# Patient Record
Sex: Male | Born: 2000 | Hispanic: Yes | Marital: Single | Smoking: Never smoker
Health system: Southern US, Community
[De-identification: ages and names within clinical notes are randomized; demographics above are authoritative.]

---
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
## 2020-10-23 ENCOUNTER — Emergency Department (HOSPITAL_COMMUNITY)
Admission: EM | Admit: 2020-10-23 | Discharge: 2020-10-23 | Disposition: A | Discharge status: Home / Self Care | Payer: BC Managed Care – PPO | Attending: Emergency Medicine | Admitting: Emergency Medicine

## 2020-10-23 ENCOUNTER — Other Ambulatory Visit: Payer: Self-pay

## 2020-10-23 ENCOUNTER — Emergency Department (HOSPITAL_COMMUNITY): Payer: BC Managed Care – PPO

## 2020-10-23 ENCOUNTER — Encounter (HOSPITAL_COMMUNITY): Payer: Self-pay | Admitting: Emergency Medicine

## 2020-10-23 DIAGNOSIS — S0081XA Abrasion of other part of head, initial encounter: Secondary | ICD-10-CM | POA: Insufficient documentation

## 2020-10-23 DIAGNOSIS — T1502XA Foreign body in cornea, left eye, initial encounter: Secondary | ICD-10-CM | POA: Diagnosis not present

## 2020-10-23 DIAGNOSIS — T1592XA Foreign body on external eye, part unspecified, left eye, initial encounter: Secondary | ICD-10-CM

## 2020-10-23 DIAGNOSIS — Y92410 Unspecified street and highway as the place of occurrence of the external cause: Secondary | ICD-10-CM | POA: Diagnosis not present

## 2020-10-23 DIAGNOSIS — S161XXA Strain of muscle, fascia and tendon at neck level, initial encounter: Secondary | ICD-10-CM | POA: Insufficient documentation

## 2020-10-23 DIAGNOSIS — W25XXXA Contact with sharp glass, initial encounter: Secondary | ICD-10-CM | POA: Insufficient documentation

## 2020-10-23 DIAGNOSIS — S199XXA Unspecified injury of neck, initial encounter: Secondary | ICD-10-CM | POA: Diagnosis present

## 2020-10-23 DIAGNOSIS — S60511A Abrasion of right hand, initial encounter: Secondary | ICD-10-CM | POA: Diagnosis not present

## 2020-10-23 MED ORDER — FLUORESCEIN SODIUM 1 MG OP STRP
1.0000 | ORAL_STRIP | Freq: Once | OPHTHALMIC | Status: AC
  Administered 2020-10-23: 1 via OPHTHALMIC
  Filled 2020-10-23: qty 1

## 2020-10-23 MED ORDER — IBUPROFEN 400 MG PO TABS
600.0000 mg | ORAL_TABLET | Freq: Once | ORAL | Status: AC
  Administered 2020-10-23: 600 mg via ORAL
  Filled 2020-10-23: qty 1

## 2020-10-23 MED ORDER — TETRACAINE HCL 0.5 % OP SOLN
2.0000 [drp] | Freq: Once | OPHTHALMIC | Status: AC
  Administered 2020-10-23: 2 [drp] via OPHTHALMIC
  Filled 2020-10-23: qty 4
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
## 2020-10-23 NOTE — Discharge Instructions (Signed)
If you develop severe headache, new or worsening pain, vomiting, weakness or numbness in her extremities, severe chest pain, shortness of breath, or abdominal pain, or any other new/concerning symptoms then return to the ER for evaluation.
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
## 2023-07-22 ENCOUNTER — Encounter: Payer: Self-pay | Admitting: Emergency Medicine

## 2023-07-22 ENCOUNTER — Encounter (HOSPITAL_COMMUNITY): Payer: Self-pay

## 2023-07-22 ENCOUNTER — Other Ambulatory Visit: Payer: Self-pay

## 2023-07-22 ENCOUNTER — Emergency Department (HOSPITAL_COMMUNITY)
Admission: EM | Admit: 2023-07-22 | Discharge: 2023-07-22 | Disposition: A | Discharge status: Home / Self Care | Payer: Medicaid Other | Attending: Emergency Medicine | Admitting: Emergency Medicine

## 2023-07-22 ENCOUNTER — Ambulatory Visit
Admission: EM | Admit: 2023-07-22 | Discharge: 2023-07-22 | Disposition: A | Discharge status: Home / Self Care | Payer: Medicaid Other

## 2023-07-22 DIAGNOSIS — R001 Bradycardia, unspecified: Secondary | ICD-10-CM | POA: Insufficient documentation

## 2023-07-22 DIAGNOSIS — M795 Residual foreign body in soft tissue: Secondary | ICD-10-CM | POA: Insufficient documentation

## 2023-07-22 DIAGNOSIS — S90454A Superficial foreign body, right lesser toe(s), initial encounter: Secondary | ICD-10-CM

## 2023-07-22 MED ORDER — LIDOCAINE HCL (PF) 1 % IJ SOLN
5.0000 mL | Freq: Once | INTRAMUSCULAR | Status: AC
  Administered 2023-07-22: 5 mL
  Filled 2023-07-22: qty 5

## 2023-07-22 NOTE — ED Provider Notes (Signed)
[facility]    CSN: [id number] Arrival date & time: 07/22/23  1401      History   Chief Complaint Chief Complaint  Patient presents with   Foreign Body in Skin    HPI Larry Porter is a 22 y.o. male.   Patient presents to urgent care today for evaluation of wooden splinter and second right toe.  He states about an hour ago he was on his wooden deck and accidentally slid his toe against the wood.  He is up-to-date with his tetanus vaccinations.  He states he did clean the wound with alcohol bacitracin and tried removal with a needle without success.  Was not wearing shoes at the time of injury.  The history is provided by the patient.    History reviewed. No pertinent past medical history.  There are no problems to display for this patient.   History reviewed. No pertinent surgical history.     Home Medications    Prior to Admission medications   Medication Sig Start Date End Date Taking? Authorizing Provider  Multiple Vitamin (MULTIVITAMIN WITH MINERALS) TABS tablet Take 1 tablet by mouth daily.    [provider]    Family History History reviewed. No pertinent family history.  Social History Social History   Tobacco Use   Smoking status: Never  Substance Use Topics   Alcohol use: No     Allergies   Patient has no known allergies.   Review of Systems Review of Systems  Constitutional:  Negative for chills and fever.  Eyes:  Negative for discharge and redness.  Respiratory:  Negative for shortness of breath.   Skin:  Positive for wound.  Neurological:  Negative for numbness.     Physical Exam Triage Vital Signs ED Triage Vitals  Encounter Vitals Group     BP      Systolic BP Percentile      Diastolic BP Percentile      Pulse      Resp      Temp      Temp src      SpO2      Weight      Height      Head Circumference      Peak Flow      Pain Score      Pain Loc      Pain Education      Exclude from Growth  Chart    No data found.  Updated Vital Signs BP 135/83 (BP Location: Left Arm)   Pulse 65   Temp 98 F (36.7 C) (Oral)   Resp 16   SpO2 98%     Physical Exam Vitals and nursing note reviewed.  Constitutional:      General: He is not in acute distress.    Appearance: Normal appearance. He is not ill-appearing.  HENT:     Head: Normocephalic and atraumatic.  Eyes:     Conjunctiva/sclera: Conjunctivae normal.  Cardiovascular:     Rate and Rhythm: Normal rate.  Pulmonary:     Effort: Pulmonary effort is normal. No respiratory distress.  Musculoskeletal:     Comments: Normal ROM of right toes  Skin:    Capillary Refill: Normal cap refill to right toes    Comments: Nonbleeding wound noted to right distal 2nd toe- no FB observed on exam- patient reports he can "feel" the splinter on palpation of distal toe  Neurological:     Mental Status: He is alert.  Comments: Gross sensation intact to distal right toes  Psychiatric:        Mood and Affect: Mood normal.        Behavior: Behavior normal.        Thought Content: Thought content normal.      UC Treatments / Results  Labs (all labs ordered are listed, but only abnormal results are displayed) Labs Reviewed - No data to display  EKG   Radiology No results found.  Procedures Procedures (including critical care time)  Medications Ordered in UC Medications - No data to display  Initial Impression / Assessment and Plan / UC Course  I have reviewed the triage vital signs and the nursing notes.  Pertinent labs & imaging results that were available during my care of the patient were reviewed by me and considered in my medical decision making (see chart for details).    Given inability to visualize foreign body on exam recommended further evaluation in the emergency room for more complicated removal.  Patient is agreeable to same.  Final Clinical Impressions(s) / UC Diagnoses   Final diagnoses:  Foreign body in  skin of right lesser toe     Discharge Instructions       Please report to ED     ED Prescriptions   None    PDMP not reviewed this encounter.   Tomi Bamberger, PA-C 07/22/23 [phone]

## 2023-07-22 NOTE — ED Provider Notes (Signed)
  [place] EMERGENCY DEPARTMENT AT [facility] Provider Note   CSN: [id number] Arrival date & time: 07/22/23  1457     History  Chief Complaint  Patient presents with   Foreign Body in Skin    Larry Porter is a 22 y.o. male.  Patient is a healthy 22 year old male presenting today with a splinter in his right foot on the third toe on the plantar surface.  He was walking across a deck with no shoes on and got the splinter in there.  He reports he was unable to get it out and went to urgent care and they could not get it out and sent him here.  His tetanus shot is up-to-date he has no other complaints.  The history is provided by the patient.       Home Medications Prior to Admission medications   Medication Sig Start Date End Date Taking? Authorizing Provider  Multiple Vitamin (MULTIVITAMIN WITH MINERALS) TABS tablet Take 1 tablet by mouth daily.    [provider]      Allergies    Patient has no known allergies.    Review of Systems   Review of Systems  Physical Exam Updated Vital Signs BP 127/77   Pulse (!) 53   Temp 98.2 F (36.8 C)   Resp 13   Ht 5\' 6"  (1.676 m)   Wt 65.8 kg   SpO2 100%   BMI 23.40 kg/m  Physical Exam Vitals reviewed.  HENT:     Head: Normocephalic.  Cardiovascular:     Rate and Rhythm: Bradycardia present.  Pulmonary:     Effort: Pulmonary effort is normal.  Musculoskeletal:       Feet:  Neurological:     Mental Status: He is alert.     ED Results / Procedures / Treatments   Labs (all labs ordered are listed, but only abnormal results are displayed) Labs Reviewed - No data to display  EKG None  Radiology No results found.  Procedures .Foreign Body Removal  Date/Time: 07/22/2023 4:49 PM  Performed by: Gwyneth Sprout, MD Authorized by: Gwyneth Sprout, MD  Consent: Verbal consent obtained. Body area: skin General location: lower extremity Location details: right second toe Anesthesia:  digital block  Anesthesia: Local Anesthetic: lidocaine 1% without epinephrine Anesthetic total: 3 mL  Sedation: Patient sedated: no  Patient restrained: no Patient cooperative: yes Localization method: visualized Removal mechanism: scalpel and forceps Dressing: dressing applied Tendon involvement: none Depth: subcutaneous Complexity: simple 1 objects recovered. Objects recovered: splinter Post-procedure assessment: foreign body removed Patient tolerance: patient tolerated the procedure well with no immediate complications      Medications Ordered in ED Medications  lidocaine (PF) (XYLOCAINE) 1 % injection 5 mL (5 mLs Infiltration Given by Other 07/22/23 1556)    ED Course/ Medical Decision Making/ A&P                                 Medical Decision Making Risk Prescription drug management.   Patient with a splinter in his toe.  Digital block performed and splinter removed as above.  Tetanus shot is up-to-date.        Final Clinical Impression(s) / ED Diagnoses Final diagnoses:  Foreign body (FB) in soft tissue    Rx / DC Orders ED Discharge Orders     None         Gwyneth Sprout, MD 07/22/23 1652

## 2023-07-22 NOTE — Discharge Instructions (Signed)
Please report to ED

## 2023-07-22 NOTE — ED Notes (Signed)
Patient is being discharged from the Urgent Care and sent to the Emergency Department via Private Vehicle . Per Provider, patient is in need of higher level of care due to Complexity. Patient is aware and verbalizes understanding of plan of care.  Vitals:   07/22/23 1409  BP: 135/83  Pulse: 65  Resp: 16  Temp: 98 F (36.7 C)  SpO2: 98%

## 2023-07-22 NOTE — ED Notes (Signed)
Pt states he was walking on a deck and got a splinter in toe. Splinter visible in right 2nd toe. Redness and swelling noted. Last Tdap 04/2023.

## 2023-07-22 NOTE — Discharge Instructions (Signed)
You can wash the area with soap and water.  Change the Band-Aid later today.  Do not put your Barefoot in a shoe without a sock.  It should heal quickly.  You can place a little Vaseline or Neosporin on the area as needed.

## 2023-07-22 NOTE — ED Triage Notes (Signed)
Splinter in second toe of right foot. Happened 1 hour ago. Last tetanus in may of this year. Patient cleaned with alcohol, bacitracin, and tried removal with needle at home prior to arrival today unsuccessfully

## 2023-07-22 NOTE — ED Triage Notes (Signed)
Pt arrives with c/o a splinter in his right foot. Per pt, splinter has been in his for for about 2 hours.

## 2024-08-24 ENCOUNTER — Emergency Department (HOSPITAL_COMMUNITY)

## 2024-08-24 ENCOUNTER — Emergency Department (HOSPITAL_COMMUNITY)
Admission: EM | Admit: 2024-08-24 | Discharge: 2024-08-24 | Disposition: A | Discharge status: Home / Self Care | Attending: Emergency Medicine | Admitting: Emergency Medicine

## 2024-08-24 ENCOUNTER — Other Ambulatory Visit: Payer: Self-pay

## 2024-08-24 ENCOUNTER — Encounter (HOSPITAL_COMMUNITY): Payer: Self-pay | Admitting: *Deleted

## 2024-08-24 DIAGNOSIS — I880 Nonspecific mesenteric lymphadenitis: Secondary | ICD-10-CM | POA: Insufficient documentation

## 2024-08-24 DIAGNOSIS — R1030 Lower abdominal pain, unspecified: Secondary | ICD-10-CM | POA: Diagnosis present

## 2024-08-24 LAB — I-STAT CHEM 8, ED
BUN: 7 mg/dL (ref 6–20)
Calcium, Ion: 1.16 mmol/L (ref 1.15–1.40)
Chloride: 101 mmol/L (ref 98–111)
Creatinine, Ser: 1.2 mg/dL (ref 0.61–1.24)
Glucose, Bld: 110 mg/dL — ABNORMAL HIGH (ref 70–99)
HCT: 45 % (ref 39.0–52.0)
Hemoglobin: 15.3 g/dL (ref 13.0–17.0)
Potassium: 4 mmol/L (ref 3.5–5.1)
Sodium: 136 mmol/L (ref 135–145)
TCO2: 23 mmol/L (ref 22–32)

## 2024-08-24 LAB — CBC
HCT: 44.3 % (ref 39.0–52.0)
Hemoglobin: 14.9 g/dL (ref 13.0–17.0)
MCH: 30.5 pg (ref 26.0–34.0)
MCHC: 33.6 g/dL (ref 30.0–36.0)
MCV: 90.6 fL (ref 80.0–100.0)
Platelets: 234 K/uL (ref 150–400)
RBC: 4.89 MIL/uL (ref 4.22–5.81)
RDW: 11.5 % (ref 11.5–15.5)
WBC: 8.4 K/uL (ref 4.0–10.5)
nRBC: 0 % (ref 0.0–0.2)

## 2024-08-24 LAB — URINALYSIS, ROUTINE W REFLEX MICROSCOPIC
Bilirubin Urine: NEGATIVE
Glucose, UA: NEGATIVE mg/dL
Hgb urine dipstick: NEGATIVE
Ketones, ur: NEGATIVE mg/dL
Leukocytes,Ua: NEGATIVE
Nitrite: NEGATIVE
Protein, ur: NEGATIVE mg/dL
Specific Gravity, Urine: 1.017 (ref 1.005–1.030)
pH: 7 (ref 5.0–8.0)

## 2024-08-24 LAB — COMPREHENSIVE METABOLIC PANEL WITH GFR
ALT: 20 U/L (ref 0–44)
AST: 20 U/L (ref 15–41)
Albumin: 4.3 g/dL (ref 3.5–5.0)
Alkaline Phosphatase: 57 U/L (ref 38–126)
Anion gap: 10 (ref 5–15)
BUN: 8 mg/dL (ref 6–20)
CO2: 24 mmol/L (ref 22–32)
Calcium: 9.3 mg/dL (ref 8.9–10.3)
Chloride: 102 mmol/L (ref 98–111)
Creatinine, Ser: 1.09 mg/dL (ref 0.61–1.24)
GFR, Estimated: >60 mL/min (ref >60)
Glucose, Bld: 105 mg/dL — ABNORMAL HIGH (ref 70–99)
Potassium: 4.1 mmol/L (ref 3.5–5.1)
Sodium: 136 mmol/L (ref 135–145)
Total Bilirubin: 1.2 mg/dL (ref 0.0–1.2)
Total Protein: 7.2 g/dL (ref 6.5–8.1)

## 2024-08-24 LAB — LIPASE, BLOOD: Lipase: 26 U/L (ref 11–51)

## 2024-08-24 MED ORDER — IOHEXOL 350 MG/ML SOLN
75.0000 mL | Freq: Once | INTRAVENOUS | Status: AC | PRN
  Administered 2024-08-24: 75 mL via INTRAVENOUS

## 2024-08-24 NOTE — Discharge Instructions (Addendum)
 Your CT scan shows that you have mesenteric adenitis.  There is no specific treatment but you may use ibuprofen  and/or Tylenol to help with any pain or fever.  Be sure to drink plenty of fluids.  If you develop new or worsening abdominal pain, vomiting, bloody diarrhea, or any other new/concerning symptoms then return to the ER.

## 2024-08-24 NOTE — ED Provider Notes (Signed)
 [place] EMERGENCY DEPARTMENT AT [facility] Provider Note   CSN: [id number] Arrival date & time: 08/24/24  8367     Patient presents with: Abdominal Pain   Larry Porter is a 23 y.o. male.   HPI 23 year old male presents from urgent care for concern for appendicitis.  Patient states he woke up this morning and had a fever, headache, body aches.  Was also having some lower abdominal discomfort.  Had some nausea but no vomiting.  No diarrhea.  Had a bowel movement that temporarily made the pain a little better.  No cough, sore throat, or shortness of breath.  Went to urgent care where he had negative point-of-care COVID and flu testing and was sent here to rule out appendicitis.  He states the pain is diffuse across his lower abdomen.  No urinary symptoms.  Pain is about a 4 currently.  Prior to Admission medications   Medication Sig Start Date End Date Taking? Authorizing Provider  Multiple Vitamin (MULTIVITAMIN WITH MINERALS) TABS tablet Take 1 tablet by mouth daily.    [provider]    Allergies: Patient has no known allergies.    Review of Systems  Constitutional:  Positive for fever.  HENT:  Negative for sore throat.   Respiratory:  Negative for cough and shortness of breath.   Cardiovascular:  Negative for chest pain.  Gastrointestinal:  Positive for abdominal pain. Negative for vomiting.  Musculoskeletal:  Positive for myalgias.  Neurological:  Positive for headaches.    Updated Vital Signs BP 128/83 (BP Location: Right Arm)   Pulse 98   Temp 100.2 F (37.9 C)   Resp 18   Ht 5' 6 (1.676 m)   Wt 65.8 kg   SpO2 95%   BMI 23.41 kg/m   Physical Exam Vitals and nursing note reviewed.  Constitutional:      General: He is not in acute distress.    Appearance: He is well-developed. He is not ill-appearing or diaphoretic.  HENT:     Head: Normocephalic and atraumatic.  Cardiovascular:     Rate and Rhythm: Normal rate.  Pulmonary:      Effort: Pulmonary effort is normal.  Abdominal:     Palpations: Abdomen is soft.     Tenderness: There is abdominal tenderness (mild) in the right lower quadrant, suprapubic area and left lower quadrant.  Skin:    General: Skin is warm and dry.  Neurological:     Mental Status: He is alert.     (all labs ordered are listed, but only abnormal results are displayed) Labs Reviewed  COMPREHENSIVE METABOLIC PANEL WITH GFR - Abnormal; Notable for the following components:      Result Value   Glucose, Bld 105 (*)    All other components within normal limits  I-STAT CHEM 8, ED - Abnormal; Notable for the following components:   Glucose, Bld 110 (*)    All other components within normal limits  LIPASE, BLOOD  CBC  URINALYSIS, ROUTINE W REFLEX MICROSCOPIC    EKG: None  Radiology: CT ABDOMEN PELVIS W CONTRAST Result Date: 08/24/2024 CLINICAL DATA:  Abdominal pain. EXAM: CT ABDOMEN AND PELVIS WITH CONTRAST TECHNIQUE: Multidetector CT imaging of the abdomen and pelvis was performed using the standard protocol following bolus administration of intravenous contrast. RADIATION DOSE REDUCTION: This exam was performed according to the departmental dose-optimization program which includes automated exposure control, adjustment of the mA and/or kV according to patient size and/or use of iterative reconstruction technique. CONTRAST:  75mL OMNIPAQUE  IOHEXOL  350 MG/ML SOLN COMPARISON:  None Available. FINDINGS: Lower chest: No acute abnormality. Hepatobiliary: There is diffuse fatty infiltration of the liver parenchyma. A punctate focus of parenchymal low attenuation is seen within the right lobe of the liver. This is too small to characterize by CT examination. No gallstones, gallbladder wall thickening, or biliary dilatation. Pancreas: Unremarkable. No pancreatic ductal dilatation or surrounding inflammatory changes. Spleen: Normal in size without focal abnormality. Adrenals/Urinary Tract: Adrenal glands are  unremarkable. Kidneys are normal, without renal calculi, focal lesion, or hydronephrosis. Bladder is unremarkable. Stomach/Bowel: Stomach is within normal limits. Appendix appears normal. No evidence of bowel wall thickening, distention, or inflammatory changes. Vascular/Lymphatic: No significant vascular findings are present. A cluster of subcentimeter mesenteric lymph nodes is seen within the medial aspect of the mid and lower right abdomen (coronal reformatted images 52 through 54, CT series 6). Reproductive: Prostate is unremarkable. Other: No abdominal wall hernia or abnormality. No abdominopelvic ascites. Musculoskeletal: No acute or significant osseous findings. IMPRESSION: 1. Hepatic steatosis. 2. Findings which may represent sequelae associated with mesenteric adenitis. 3. Normal appendix. Electronically Signed   By: Suzen Dials M.D.   On: 08/24/2024 18:19     Procedures   Medications Ordered in the ED  iohexol  (OMNIPAQUE ) 350 MG/ML injection 75 mL (75 mLs Intravenous Contrast Given 08/24/24 1800)                                    Medical Decision Making Amount and/or Complexity of Data Reviewed External Data Reviewed: notes.    Details: Urgent care notes Labs:     Details: Normal WBC Radiology: independent interpretation performed.    Details: No appendicitis   Patient has minimal tenderness on exam.  He is overall well-appearing.  Low-grade temp here but otherwise vitals are reassuring.  CT shows no appendicitis but is consistent with mesenteric Adenitis.  Discussed supportive care such as NSAIDs, Tylenol, fluids.  He otherwise declines anything for pain.  Appears stable for discharge home with return precautions.     Final diagnoses:  Mesenteric adenitis    ED Discharge Orders     None          Freddi Hamilton, MD 08/24/24 1945

## 2024-08-24 NOTE — ED Notes (Signed)
 Pt does not want medication for pain at this time. Pt encouraged to use call bell if indicated.

## 2024-08-24 NOTE — ED Triage Notes (Signed)
 The pt woke up this am with abd pain and a temp  he was seen at urgent care at [facility]  was given motrin  and sent here to r/o appendicitis  [facility]

## 2024-08-24 NOTE — ED Notes (Signed)
Patient verbalizes understanding of discharge instructions. Opportunity for questioning and answers were provided. Armband removed by staff, pt discharged from ED. Ambulated out to lobby
